# Patient Record
Sex: Male | Born: 1935 | Race: White | Hispanic: No | Marital: Married | Smoking: Never smoker
Health system: Southern US, Community
[De-identification: ages and names within clinical notes are randomized; demographics above are authoritative.]

## PROBLEM LIST (undated history)

## (undated) DIAGNOSIS — I639 Cerebral infarction, unspecified: Secondary | ICD-10-CM

## (undated) DIAGNOSIS — C61 Malignant neoplasm of prostate: Secondary | ICD-10-CM

## (undated) DIAGNOSIS — R930 Abnormal findings on diagnostic imaging of skull and head, not elsewhere classified: Secondary | ICD-10-CM

## (undated) DIAGNOSIS — I10 Essential (primary) hypertension: Secondary | ICD-10-CM

---

## 2014-07-15 ENCOUNTER — Inpatient Hospital Stay (HOSPITAL_COMMUNITY)
Admission: AD | Admit: 2014-07-15 | Discharge: 2014-07-18 | DRG: 684 | Disposition: A | Discharge status: Home / Self Care | Payer: Medicare Other | Source: Other Acute Inpatient Hospital | Attending: Internal Medicine | Admitting: Internal Medicine

## 2014-07-15 DIAGNOSIS — N400 Enlarged prostate without lower urinary tract symptoms: Secondary | ICD-10-CM | POA: Diagnosis present

## 2014-07-15 DIAGNOSIS — Z8673 Personal history of transient ischemic attack (TIA), and cerebral infarction without residual deficits: Secondary | ICD-10-CM | POA: Diagnosis not present

## 2014-07-15 DIAGNOSIS — N1339 Other hydronephrosis: Secondary | ICD-10-CM | POA: Diagnosis present

## 2014-07-15 DIAGNOSIS — Z8546 Personal history of malignant neoplasm of prostate: Secondary | ICD-10-CM | POA: Diagnosis not present

## 2014-07-15 DIAGNOSIS — I10 Essential (primary) hypertension: Secondary | ICD-10-CM | POA: Diagnosis present

## 2014-07-15 DIAGNOSIS — N139 Obstructive and reflux uropathy, unspecified: Secondary | ICD-10-CM | POA: Diagnosis present

## 2014-07-15 DIAGNOSIS — N320 Bladder-neck obstruction: Secondary | ICD-10-CM | POA: Diagnosis present

## 2014-07-15 DIAGNOSIS — E875 Hyperkalemia: Secondary | ICD-10-CM | POA: Diagnosis present

## 2014-07-15 DIAGNOSIS — N179 Acute kidney failure, unspecified: Secondary | ICD-10-CM | POA: Diagnosis present

## 2014-07-15 DIAGNOSIS — I639 Cerebral infarction, unspecified: Secondary | ICD-10-CM | POA: Diagnosis present

## 2014-07-15 HISTORY — DX: Cerebral infarction, unspecified: I63.9

## 2014-07-15 HISTORY — DX: Malignant neoplasm of prostate: C61

## 2014-07-15 HISTORY — DX: Essential (primary) hypertension: I10

## 2014-07-15 HISTORY — DX: Abnormal findings on diagnostic imaging of skull and head, not elsewhere classified: R93.0

## 2014-07-16 ENCOUNTER — Encounter (HOSPITAL_COMMUNITY): Payer: Self-pay | Admitting: Internal Medicine

## 2014-07-16 ENCOUNTER — Inpatient Hospital Stay (HOSPITAL_COMMUNITY): Payer: Medicare Other

## 2014-07-16 DIAGNOSIS — R930 Abnormal findings on diagnostic imaging of skull and head, not elsewhere classified: Secondary | ICD-10-CM | POA: Insufficient documentation

## 2014-07-16 DIAGNOSIS — I059 Rheumatic mitral valve disease, unspecified: Secondary | ICD-10-CM

## 2014-07-16 DIAGNOSIS — I639 Cerebral infarction, unspecified: Secondary | ICD-10-CM | POA: Diagnosis present

## 2014-07-16 DIAGNOSIS — N139 Obstructive and reflux uropathy, unspecified: Secondary | ICD-10-CM | POA: Diagnosis present

## 2014-07-16 DIAGNOSIS — N400 Enlarged prostate without lower urinary tract symptoms: Secondary | ICD-10-CM | POA: Diagnosis present

## 2014-07-16 DIAGNOSIS — N178 Other acute kidney failure: Secondary | ICD-10-CM

## 2014-07-16 DIAGNOSIS — E875 Hyperkalemia: Secondary | ICD-10-CM | POA: Diagnosis present

## 2014-07-16 DIAGNOSIS — I10 Essential (primary) hypertension: Secondary | ICD-10-CM | POA: Diagnosis present

## 2014-07-16 DIAGNOSIS — N179 Acute kidney failure, unspecified: Secondary | ICD-10-CM | POA: Insufficient documentation

## 2014-07-16 LAB — CBC WITH DIFFERENTIAL/PLATELET
BASOS ABS: 0 10*3/uL (ref 0.0–0.1)
Basophils Relative: 1 % (ref 0–1)
EOS PCT: 2 % (ref 0–5)
Eosinophils Absolute: 0.1 10*3/uL (ref 0.0–0.7)
HCT: 32.2 % — ABNORMAL LOW (ref 39.0–52.0)
Hemoglobin: 11 g/dL — ABNORMAL LOW (ref 13.0–17.0)
Lymphocytes Relative: 14 % (ref 12–46)
Lymphs Abs: 0.5 10*3/uL — ABNORMAL LOW (ref 0.7–4.0)
MCH: 28.7 pg (ref 26.0–34.0)
MCHC: 34.2 g/dL (ref 30.0–36.0)
MCV: 84.1 fL (ref 78.0–100.0)
Monocytes Absolute: 0.4 10*3/uL (ref 0.1–1.0)
Monocytes Relative: 11 % (ref 3–12)
NEUTROS PCT: 72 % (ref 43–77)
Neutro Abs: 2.9 10*3/uL (ref 1.7–7.7)
Platelets: 139 10*3/uL — ABNORMAL LOW (ref 150–400)
RBC: 3.83 MIL/uL — ABNORMAL LOW (ref 4.22–5.81)
RDW: 14 % (ref 11.5–15.5)
WBC: 3.9 10*3/uL — ABNORMAL LOW (ref 4.0–10.5)

## 2014-07-16 LAB — BASIC METABOLIC PANEL
ANION GAP: 20 — AB (ref 5–15)
BUN: 61 mg/dL — AB (ref 6–23)
CHLORIDE: 108 meq/L (ref 96–112)
CO2: 17 mEq/L — ABNORMAL LOW (ref 19–32)
Calcium: 9.8 mg/dL (ref 8.4–10.5)
Creatinine, Ser: 5.78 mg/dL — ABNORMAL HIGH (ref 0.50–1.35)
GFR calc Af Amer: 9 mL/min — ABNORMAL LOW (ref >90)
GFR calc non Af Amer: 8 mL/min — ABNORMAL LOW (ref >90)
Glucose, Bld: 80 mg/dL (ref 70–99)
POTASSIUM: 5 meq/L (ref 3.7–5.3)
Sodium: 145 mEq/L (ref 137–147)

## 2014-07-16 LAB — URINALYSIS, ROUTINE W REFLEX MICROSCOPIC
Bilirubin Urine: NEGATIVE
Glucose, UA: NEGATIVE mg/dL
Ketones, ur: 15 mg/dL — AB
Leukocytes, UA: NEGATIVE
NITRITE: NEGATIVE
PROTEIN: 30 mg/dL — AB
Specific Gravity, Urine: 1.01 (ref 1.005–1.030)
UROBILINOGEN UA: 0.2 mg/dL (ref 0.0–1.0)
pH: 7 (ref 5.0–8.0)

## 2014-07-16 LAB — PROTIME-INR
INR: 1.29 (ref 0.00–1.49)
PROTHROMBIN TIME: 16.3 s — AB (ref 11.6–15.2)

## 2014-07-16 LAB — COMPREHENSIVE METABOLIC PANEL
ALBUMIN: 3.9 g/dL (ref 3.5–5.2)
ALT: 20 U/L (ref 0–53)
AST: 18 U/L (ref 0–37)
Alkaline Phosphatase: 51 U/L (ref 39–117)
Anion gap: 19 — ABNORMAL HIGH (ref 5–15)
BUN: 75 mg/dL — AB (ref 6–23)
CALCIUM: 9.7 mg/dL (ref 8.4–10.5)
CO2: 19 mEq/L (ref 19–32)
Chloride: 106 mEq/L (ref 96–112)
Creatinine, Ser: 8.78 mg/dL — ABNORMAL HIGH (ref 0.50–1.35)
GFR calc non Af Amer: 5 mL/min — ABNORMAL LOW (ref >90)
GFR, EST AFRICAN AMERICAN: 6 mL/min — AB (ref >90)
GLUCOSE: 84 mg/dL (ref 70–99)
Potassium: 5.2 mEq/L (ref 3.7–5.3)
SODIUM: 144 meq/L (ref 137–147)
Total Bilirubin: 1 mg/dL (ref 0.3–1.2)
Total Protein: 7.2 g/dL (ref 6.0–8.3)

## 2014-07-16 LAB — URINE MICROSCOPIC-ADD ON

## 2014-07-16 LAB — MRSA PCR SCREENING: MRSA by PCR: NEGATIVE

## 2014-07-16 LAB — PHOSPHORUS: PHOSPHORUS: 5 mg/dL — AB (ref 2.3–4.6)

## 2014-07-16 LAB — MAGNESIUM: Magnesium: 2.3 mg/dL (ref 1.5–2.5)

## 2014-07-16 MED ORDER — SODIUM CHLORIDE 0.9 % IV SOLN
INTRAVENOUS | Status: DC
  Administered 2014-07-16: 50 mL/h via INTRAVENOUS
  Administered 2014-07-16: 02:00:00 via INTRAVENOUS

## 2014-07-16 MED ORDER — METOPROLOL TARTRATE 12.5 MG HALF TABLET
12.5000 mg | ORAL_TABLET | Freq: Two times a day (BID) | ORAL | Status: DC
Start: 2014-07-16 — End: 2014-07-18
  Administered 2014-07-16 – 2014-07-17 (×3): 12.5 mg via ORAL
  Filled 2014-07-16 (×6): qty 1

## 2014-07-16 MED ORDER — STERILE WATER FOR INJECTION IV SOLN
Freq: Once | INTRAVENOUS | Status: AC
  Administered 2014-07-16: 17:00:00 via INTRAVENOUS
  Filled 2014-07-16: qty 850

## 2014-07-16 MED ORDER — ACETAMINOPHEN 325 MG PO TABS
650.0000 mg | ORAL_TABLET | Freq: Four times a day (QID) | ORAL | Status: DC | PRN
  Administered 2014-07-16 (×2): 650 mg via ORAL
  Filled 2014-07-16 (×2): qty 2

## 2014-07-16 MED ORDER — PHENAZOPYRIDINE HCL 100 MG PO TABS: 100.0000 mg | ORAL_TABLET | Freq: Three times a day (TID) | ORAL | Status: DC

## 2014-07-16 MED ORDER — HYDRALAZINE HCL 25 MG PO TABS
25.0000 mg | ORAL_TABLET | Freq: Four times a day (QID) | ORAL | Status: DC
  Administered 2014-07-16 – 2014-07-18 (×11): 25 mg via ORAL
  Filled 2014-07-16 (×16): qty 1

## 2014-07-16 MED ORDER — SODIUM CHLORIDE 0.9 % IJ SOLN
3.0000 mL | Freq: Two times a day (BID) | INTRAMUSCULAR | Status: DC
  Administered 2014-07-16 – 2014-07-18 (×6): 3 mL via INTRAVENOUS

## 2014-07-16 MED ORDER — HEPARIN SODIUM (PORCINE) 5000 UNIT/ML IJ SOLN
5000.0000 [IU] | Freq: Three times a day (TID) | INTRAMUSCULAR | Status: DC
  Administered 2014-07-16 – 2014-07-17 (×5): 5000 [IU] via SUBCUTANEOUS
  Filled 2014-07-16 (×7): qty 1

## 2014-07-16 MED ORDER — ACETAMINOPHEN 650 MG RE SUPP: 650.0000 mg | Freq: Four times a day (QID) | RECTAL | Status: DC | PRN

## 2014-07-16 MED ORDER — ONDANSETRON HCL 4 MG PO TABS: 4.0000 mg | ORAL_TABLET | Freq: Four times a day (QID) | ORAL | Status: DC | PRN

## 2014-07-16 MED ORDER — ONDANSETRON HCL 4 MG/2ML IJ SOLN: 4.0000 mg | Freq: Four times a day (QID) | INTRAMUSCULAR | Status: DC | PRN

## 2014-07-16 MED ORDER — HYDRALAZINE HCL 20 MG/ML IJ SOLN
10.0000 mg | INTRAMUSCULAR | Status: DC | PRN
  Administered 2014-07-16: 10 mg via INTRAVENOUS
  Filled 2014-07-16: qty 1

## 2014-07-16 NOTE — H&P (Signed)
[facility] History and Physical  Patient: Chris Castillo  OTL:[id number]  DOB: 11-06-1928  DOS: the patient was seen and examined on 07/15/2014 PCP: [facility]  Chief Complaint: Diarrhea and leg swelling  HPI: Chris Castillo is a 80 y.o. male with Past medical history of hypertension, CVA, prostatic hypertrophy with possible history of cancer as well. The patient presents with complaints of diarrhea and leg swelling. He mentions that he had diarrhea for 2 weeks and after the third day of diarrhea he started noticing developing swelling in both his legs. During the episodes of diarrhea over 2 weeks he has gained 10 pounds. 3 days ago he stopped urinating and at which time he saw his PCP. His PCP sent him to hospital for an ultrasound and further lab work. Later on he was seen in the ER with above mentioned complaint and was found to have hyperkalemia and acute kidney injury and was treated for the same emergently and was sent here for further workup. At the time of my evaluation he complains of left leg cramps and burning at the insertion of the Foley catheter but does not have any complaint of any fever, chills, headache, cough, chest pain, palpitation, shortness of breath, orthopnea, PND, nausea, vomiting, abdominal pain, diarrhea, constipation, active bleeding, burning urination, dizziness, pedal edema,  focal neurological deficit.  He mentions he is compliant with all his medication and takes a medication for blood pressure although he does not remember the name. He mentions he has seen a urologist in the past but does not remember the name and has not seen him for a while. Patient mentions his diarrhea has improved and has not had any bowel movement since 10:00 in the morning. There was no blood in the bowels and patient denies any recent use of antibiotics or recent travel.  Patient received IV Lasix as well as a Foley catheter insertion after which she urinated 1500  mL of urine.  The patient is coming from home And at his baseline independent for most of his ADL.  Review of Systems: as mentioned in the history of present illness.  A Comprehensive review of the other systems is negative.  No past medical history on file. No past surgical history on file. Social History:  has no tobacco, alcohol, and drug history on file.  Not on File  No family history on file.  Prior to Admission medications   Medication Sig Start Date End Date Taking? Authorizing Provider  lisinopril (PRINIVIL,ZESTRIL) 10 MG tablet Take 10 mg by mouth daily.   Yes Historical Provider, MD  Multiple Vitamin (MULTIVITAMIN) capsule Take 1 capsule by mouth daily.   Yes Historical Provider, MD    Physical Exam: Filed Vitals:   07/15/14 2305  BP: 179/85  Pulse: 60  Temp: 97.9 F (36.6 C)  TempSrc: Oral  Resp: 11  Height: 5\' 6"  (1.676 m)  Weight: 76.8 kg (169 lb 5 oz)  SpO2: 99%    General: Alert, Awake and Oriented to Time, Place and Person. Appear in mild distress Eyes: PERRL ENT: Oral Mucosa clear moist. Neck: no JVD Cardiovascular: S1 and S2 Present, no Murmur, Peripheral Pulses Present Respiratory: Bilateral Air entry equal and Decreased, Clear to Auscultation, noCrackles, no wheezes Abdomen: Bowel Sound present, Soft and no tender Skin: no Rash Extremities: Bilateral Pedal edema, bilateral calf tenderness Neurologic: Grossly no focal neuro deficit.  Labs on Admission:  CBC:  Recent Labs Lab 07/16/14 0052  WBC 3.9*  NEUTROABS 2.9  HGB 11.0*  HCT 32.2*  MCV 84.1  PLT 139*    CMP     Component Value Date/Time   NA 144 07/16/2014 0052   K 5.2 07/16/2014 0052   CL 106 07/16/2014 0052   CO2 19 07/16/2014 0052   GLUCOSE 84 07/16/2014 0052   BUN 75* 07/16/2014 0052   CREATININE 8.78* 07/16/2014 0052   CALCIUM 9.7 07/16/2014 0052   PROT 7.2 07/16/2014 0052   ALBUMIN 3.9 07/16/2014 0052   AST 18 07/16/2014 0052   ALT 20 07/16/2014 0052   ALKPHOS 51  07/16/2014 0052   BILITOT 1.0 07/16/2014 0052   GFRNONAA 5* 07/16/2014 0052   GFRAA 6* 07/16/2014 0052   Radiological Exams on Admission: No results found.  CT scan of the abdomen in the other facility showed bilateral hydronephrosis with obstructive uropathy and enlarged prostate.  EKG: Independently reviewed. nonspecific ST and T waves changes, normal sinus rhythm   Assessment/Plan Principal Problem:   AKI (acute kidney injury) Active Problems:   Accelerated hypertension   Acute bilateral obstructive uropathy   Prostatic hypertrophy   Hyperkalemia   CVA (cerebral infarction)   1. AKI (acute kidney injury) The patient is presenting with complaints of diarrhea with decreased urination and bilateral leg swelling. On further workup he was found to have acute kidney injury with serum creatinine of 19 with other parameters being within normal range and CT of the abdomen showing significantly enlarged bladder along with enlarged prostate and bilateral hydronephrosis. He has urinated more than 1 L after insertion of the Foley catheter and he continues to do so right now. He has also received Lasix 40 mg IV in the other facility. Currently IV monitor his I's and O's closely and obtain further workup. Nephrology will be consulted. He may also require consultation with urology. For his hyperkalemia he has received bicarbonate dextrose and insulin and currently his potassium is improved significantly. We will continue to closely monitor.  2. Accelerated hypertension. Patient is on ACE inhibitor was at home at present I would hold them and put him on hydralazine IV as well as oral when necessary.  3. History of prostate cancer. Check PSA.  4. History of CVA. Close monitoring at present.  Advance goals of care discussion: Full code   Consults: Nephrology  DVT Prophylaxis: subcutaneous Heparin Nutrition: Renal diet  Family Communication: Attempted to reach the daughter were not  successful Disposition: Admitted to inpatient in step-down unit.  Author: Berle Mull, MD [facility] Pager: [phone] 07/16/2014, 12:38 AM    If 7PM-7AM, please contact night-coverage [email or web address] Password [email or web address]

## 2014-07-16 NOTE — Progress Notes (Signed)
Nephrogy:      Discussed with primary.  Cr decreased 19 to 8s.in < 24 h.  This is obstructive uropathy with AKI.  Amount of recovery to be determined but we will not do formal consult unless does not cont to improve, to simplify course.  Needs Urology eval

## 2014-07-16 NOTE — Progress Notes (Signed)
Utilization Review Completed.Donne Anon T12/15/2015

## 2014-07-16 NOTE — Progress Notes (Signed)
Pt is asleep. Pt has had no complaints of pain. Pt has had no requests. Pt speaks a minimal amount of english.

## 2014-07-16 NOTE — Progress Notes (Addendum)
[facility] TEAM 1 - Stepdown/ICU TEAM Progress Note  Chris Castillo HEN:[id number] DOB: May 04, 1929 DOA: 07/15/2014 PCP: [facility]  Admit HPI / Brief Narrative: Chris Castillo is a 80 y.o. HM PMHx hypertension, CVA, prostatic hypertrophy with Hx  Cancer. The patient presents with complaints of diarrhea and leg swelling. He mentions that he had diarrhea for 2 weeks and after the third day of diarrhea he started noticing developing swelling in both his legs. During the episodes of diarrhea over 2 weeks he has gained 10 pounds. 3 days ago he stopped urinating and at which time he saw his PCP. His PCP sent him to hospital for an ultrasound and further lab work. Later on he was seen in the ER with above mentioned complaint and was found to have hyperkalemia and acute kidney injury and was treated for the same emergently and was sent here for further workup. At the time of my evaluation he complains of left leg cramps and burning at the insertion of the Foley catheter but does not have any complaint of any fever, chills, headache, cough, chest pain, palpitation, shortness of breath, orthopnea, PND, nausea, vomiting, abdominal pain, diarrhea, constipation, active bleeding, burning urination, dizziness, pedal edema, focal neurological deficit.  He mentions he is compliant with all his medication and takes a medication for blood pressure although he does not remember the name. He mentions he has seen a urologist in the past but does not remember the name and has not seen him for a while. Patient mentions his diarrhea has improved and has not had any bowel movement since 10:00 in the morning. There was no blood in the bowels and patient denies any recent use of antibiotics or recent travel.  Patient received IV Lasix as well as a Foley catheter insertion after which he urinated 1500 mL of urine.   HPI/Subjective: 12/15 A/O 4, states feels improved since admission. NOTE patient's  primary language is Spanish however does understand simple [place]. Negative abdominal pain, negative diarrhea today. States last episode of diarrhea was yesterday at 1000, described as watery. States diarrhea started approximately 2 weeks ago. Negative fever/chills.  Assessment/Plan: AKI (acute kidney injury) -Improving with hydration  -Nephrology aware patient, but believes most likely secondary to obstruction. Spoke with Dr. Jeneen Rinks  Deterding (nephrology)  -In HPI abdominal CT mentioned however in Epic not available. Will obtain renal ultrasound. If obstruction apparent an ultrasound will consult urology. -Hold all nephrotoxic medication  -Bicarbonate 150 mEq in sterile water 1 -Continue gentle hydration normal saline 11ml/hr  Hyperkalemia -Resolved continue to monitor closely  Accelerated hypertension. -Patient is on ACE inhibitor at home will hold secondary to acute renal failure.  -Start metoprolol 12.5 mg BID -Continue hydralazine 25 mg QID -Obtain echocardiogram; suspect CHF.   Pedal  Edema -Most likely multifactorial acute renal failure, and CHF?  Hx Prostate cancer. -Hormone shots, Pt decided to stop ~ (1) yr ago per daughter  -PSA pending  -Renal ultrasound  Diarrhea -Obtain GI pathogen panel  History of CVA. -Control BP aggressively  -Obtain lipid panel   Code Status: FULL Family Communication: Spoke with daughter ice this manner over the phone, granddaughter, and wife at bedside  Disposition Plan: Resolution of renal failure    Consultants: Dr. Jeneen Rinks  Deterding (nephrology) phone consult    Procedure/Significant Events: Echocardiogram pending Renal ultrasound pending   Culture NA  Antibiotics: NA  DVT prophylaxis: SCD   Devices    LINES / TUBES:  Continuous Infusions: . sodium chloride 75 mL/hr at 07/16/14 0141    Objective: VITAL SIGNS: Temp: 98.5 F (36.9 C) (12/15 0733) Temp Source: Oral (12/15 0733) BP: 149/62 mmHg  (12/15 0733) Pulse Rate: 65 (12/15 1000) SPO2; FIO2:   Intake/Output Summary (Last 24 hours) at 07/16/14 1106 Last data filed at 07/16/14 0738  Gross per 24 hour  Intake    240 ml  Output   3750 ml  Net  -3510 ml     Exam: General: A/O 4, NAD, No acute respiratory distress Lungs: Clear to auscultation bilaterally without wheezes or crackles Cardiovascular: Regular rate and rhythm without murmur gallop or rub normal S1 and S2 Abdomen: Nontender, nondistended, soft, bowel sounds positive, no rebound, no ascites, no appreciable mass Extremities: No significant cyanosis, clubbing. 3+- 2+ bilateral pedal edema to thighs Lt > Rt,   Data Reviewed: Basic Metabolic Panel:  Recent Labs Lab 07/16/14 0052 07/16/14 0758  NA 144 145  K 5.2 5.0  CL 106 108  CO2 19 17*  GLUCOSE 84 80  BUN 75* 61*  CREATININE 8.78* 5.78*  CALCIUM 9.7 9.8  MG 2.3  --   PHOS 5.0*  --    Liver Function Tests:  Recent Labs Lab 07/16/14 0052  AST 18  ALT 20  ALKPHOS 51  BILITOT 1.0  PROT 7.2  ALBUMIN 3.9   No results for input(s): LIPASE, AMYLASE in the last 168 hours. No results for input(s): AMMONIA in the last 168 hours. CBC:  Recent Labs Lab 07/16/14 0052  WBC 3.9*  NEUTROABS 2.9  HGB 11.0*  HCT 32.2*  MCV 84.1  PLT 139*   Cardiac Enzymes: No results for input(s): CKTOTAL, CKMB, CKMBINDEX, TROPONINI in the last 168 hours. BNP (last 3 results) No results for input(s): PROBNP in the last 8760 hours. CBG: No results for input(s): GLUCAP in the last 168 hours.  Recent Results (from the past 240 hour(s))  MRSA PCR Screening     Status: None   Collection Time: 07/16/14 12:42 AM  Result Value Ref Range Status   MRSA by PCR NEGATIVE NEGATIVE Final    Comment:        The GeneXpert MRSA Assay (FDA approved for NASAL specimens only), is one component of a comprehensive MRSA colonization surveillance program. It is not intended to diagnose MRSA infection nor to guide  or monitor treatment for MRSA infections.      Studies:  Recent x-ray studies have been reviewed in detail by the Attending Physician  Scheduled Meds:  Scheduled Meds: . heparin  5,000 Units Subcutaneous 3 times per day  . hydrALAZINE  25 mg Oral 4 times per day  . sodium chloride  3 mL Intravenous Q12H    Time spent on care of this patient: 40 mins   Allie Bossier , MD   [facility] Office  [phone] Pager [phone]  On-Call/Text Page:      [email or web address]      password [email or web address]  If 7PM-7AM, please contact night-coverage [email or web address] Password [email or web address] 07/16/2014, 11:06 AM   LOS: 1 day

## 2014-07-16 NOTE — Progress Notes (Signed)
Nutrition Brief Note  Patient identified on the Malnutrition Screening Tool (MST) Report for recent weight lost without trying and eating poorly because of a decreased appetite.  Pt reports a 10 lb weight gain.  Wt Readings from Last 15 Encounters:  07/16/14 169 lb 5 oz (76.8 kg)    Body mass index is 27.34 kg/(m^2). Patient meets criteria for Overweight based on current BMI.   Current diet order is Renal w/1200 ml.  Reports a good appetite and is consuming approximately 75% of meals at this time. Labs and medications reviewed.   No nutrition interventions warranted at this time. If nutrition issues arise, please consult RD.   Arthur Holms, RD, LDN Pager #: [phone] After-Hours Pager #: [phone]

## 2014-07-16 NOTE — Progress Notes (Signed)
Echocardiogram 2D Echocardiogram has been performed.  Chris Castillo 07/16/2014, 3:22 PM

## 2014-07-17 LAB — COMPREHENSIVE METABOLIC PANEL
ALT: 15 U/L (ref 0–53)
ANION GAP: 14 (ref 5–15)
AST: 14 U/L (ref 0–37)
Albumin: 3.6 g/dL (ref 3.5–5.2)
Alkaline Phosphatase: 45 U/L (ref 39–117)
BUN: 29 mg/dL — AB (ref 6–23)
CO2: 20 meq/L (ref 19–32)
Calcium: 9.4 mg/dL (ref 8.4–10.5)
Chloride: 105 mEq/L (ref 96–112)
Creatinine, Ser: 1.87 mg/dL — ABNORMAL HIGH (ref 0.50–1.35)
GFR calc Af Amer: 37 mL/min — ABNORMAL LOW (ref >90)
GFR calc non Af Amer: 32 mL/min — ABNORMAL LOW (ref >90)
GLUCOSE: 101 mg/dL — AB (ref 70–99)
POTASSIUM: 4.2 meq/L (ref 3.7–5.3)
Sodium: 139 mEq/L (ref 137–147)
Total Bilirubin: 0.9 mg/dL (ref 0.3–1.2)
Total Protein: 6.7 g/dL (ref 6.0–8.3)

## 2014-07-17 LAB — CBC WITH DIFFERENTIAL/PLATELET
Basophils Absolute: 0 10*3/uL (ref 0.0–0.1)
Basophils Relative: 0 % (ref 0–1)
Eosinophils Absolute: 0.1 10*3/uL (ref 0.0–0.7)
Eosinophils Relative: 4 % (ref 0–5)
HCT: 32.1 % — ABNORMAL LOW (ref 39.0–52.0)
HEMOGLOBIN: 10.8 g/dL — AB (ref 13.0–17.0)
LYMPHS ABS: 0.8 10*3/uL (ref 0.7–4.0)
Lymphocytes Relative: 20 % (ref 12–46)
MCH: 28.3 pg (ref 26.0–34.0)
MCHC: 33.6 g/dL (ref 30.0–36.0)
MCV: 84 fL (ref 78.0–100.0)
MONOS PCT: 11 % (ref 3–12)
Monocytes Absolute: 0.5 10*3/uL (ref 0.1–1.0)
Neutro Abs: 2.6 10*3/uL (ref 1.7–7.7)
Neutrophils Relative %: 65 % (ref 43–77)
Platelets: 153 10*3/uL (ref 150–400)
RBC: 3.82 MIL/uL — AB (ref 4.22–5.81)
RDW: 13.9 % (ref 11.5–15.5)
WBC: 4 10*3/uL (ref 4.0–10.5)

## 2014-07-17 LAB — LIPID PANEL
Cholesterol: 190 mg/dL (ref 0–200)
HDL: 49 mg/dL (ref >39)
LDL Cholesterol: 126 mg/dL — ABNORMAL HIGH (ref 0–99)
Total CHOL/HDL Ratio: 3.9 RATIO
Triglycerides: 76 mg/dL (ref <150)
VLDL: 15 mg/dL (ref 0–40)

## 2014-07-17 LAB — MAGNESIUM: Magnesium: 1.8 mg/dL (ref 1.5–2.5)

## 2014-07-17 LAB — PSA: PSA: 54.8 ng/mL — AB (ref <=4.00)

## 2014-07-17 MED ORDER — TAMSULOSIN HCL 0.4 MG PO CAPS
0.4000 mg | ORAL_CAPSULE | Freq: Every day | ORAL | Status: DC
  Administered 2014-07-17 – 2014-07-18 (×2): 0.4 mg via ORAL
  Filled 2014-07-17 (×3): qty 1

## 2014-07-17 MED ORDER — ASPIRIN EC 81 MG PO TBEC: 81.0000 mg | DELAYED_RELEASE_TABLET | Freq: Every day | ORAL | Status: DC

## 2014-07-17 NOTE — Progress Notes (Addendum)
[facility] TEAM 1 - Stepdown/ICU TEAM Progress Note  Chris Castillo RXY:[id number] DOB: [birth year] DOA: 07/15/2014 PCP: [facility]  Brief Narrative: 80 year old male patient with past medical history of hypertension, reported old CVA, prosthetic hypertrophy with apparent history of cancer. Presented to the emergency department after experiencing 2 weeks of diarrhea subsequent development of edema in both legs. He reported a 10 pound gain in weight over the past 2 weeks. 3 days prior to presentation he stopped urinating and followed up with his primary care physician. He was sent to the hospital for an ultrasound and lab work. His lab work revealed acute renal failure. Baseline renal function was not reported.  He was subsequently sent to the ER and laboratory data here revealed BUN 75 creatinine 8.78 with a potassium of 5.2. A Foley catheter was placed. Review of medication revealed patient was on ACE inhibitor for hypertension prior to admission. When questioned he did report seeing a urologist previously but does not remember the name of the urologist. He was also given a dose of IV Lasix in the ER and after placement of the catheter 1500 mL of urine was obtained.  A renal ultrasound completed after arrival to this facility revealed mild-to-moderate symmetrical bilateral hydronephrosis without obstructing stone or mass lesion. He was also noted with urinary bladder wall thickening likely due to chronic bladder outflow obstruction. Since admission he has been hydrated. He was evaluated by nephrology who felt this was an obstructive uropathy based on ultrasound findings and rapid improvement in renal function after Foley catheter placed. By 12/16 his creatinine had decreased to 1.87 with a BUN of 29.  On 12/16 Dr. Eulogio Ditch with urology was contacted. He agreed with our starting Flomax and recommended leaving the Foley catheter in if the patient was to be discharged within the next 48  hours. He recommended the patient follow-up at his office after discharge. The office was contacted on 12/16 and they are in the process of arranging an outpatient appointment.  Subjective: Alert, notes having increased urinary output and attributes this to Foley. Patient speaks limited English but by talking with him and utilizing drawings of urinary anatomy patient was able to somewhat understand current clinical status. No complaints verbalized.  Assessment/Plan:  AKI secondary to obstructive uropathy -Improving after placement of Foley catheter and therefore relieving obstructive process as well as with hydration -Decrease IV fluids to keep open since hypertensive -Nephrology endorses etiology most likely secondary to obstruction. -Renal ultrasound demonstrates mild residual bilateral hydronephrosis without any stone or obstructing mass lesion. With history of BPH suspect this is etiology to patient's uropathy; also has bladder wall thickening which suggests a chronic process -Continue to hold ACE inhibitor -Begin Flomax  Hyperkalemia -Resolved with improvement in renal function  Accelerated hypertension. -Home ACE inhibitor on hold -Was started on metoprolol and hydralazine this admission -Have also started Flomax for suspected BPH mediated obstructive uropathy so monitor blood pressure closely -Echocardiogram completed this admission with preserved LV function and grade 1 diastolic dysfunction with moderate AR  Pedal  Edema -With preserved LV function despite moderate aortic regurgitation suspect edema more reflective of acute nephropathy related to acute renal failure (see above) -Edema resolving  Hx Prostate cancer -Apparently was on hormone injections previously but decided to stop ~ (1) yr ago per daughter  -PSA elevated at 54.8 but baseline unknown; patient to follow up with Urologist after discharge  Diarrhea -Resolved and likely related to acute renal failure -GI  pathogen panel pending  History of HEMORRHAGIC CVA (per daughter's hx) -Living at home prior to admission - family requests NO ASA or heparin products  Code Status: FULL Family Communication: No family at bedside; have attempted 1 to call all 3 numbers in chart to discuss current discharge plan with daughter Lindajo Royal but was unable to reach her DVT prophylaxis: Continue SCDs alone, still with some mild hematuria likely from Foley trauma Disposition Plan: Transfer to floor  Consultants: Dr. Jeneen Rinks  Deterding (nephrology) phone consult  Procedure/Significant Events: Echocardiogram: Left ventricle: The cavity size was normal. Wall thickness was normal. Systolic function was normal. The estimated ejection fraction was in the range of 55% to 60%. Wall motion was normal; there were no regional wall motion abnormalities. Doppler parameters are consistent with abnormal left ventricular relaxation (grade 1 diastolic dysfunction). - Aortic valve: There was moderate regurgitation. - Aortic root: The aortic root was mildly dilated. - Pulmonary arteries: Systolic pressure was mildly increased.  Objective: Blood pressure 157/84, pulse 77, temperature 98.3 F (36.8 C), temperature source Oral, resp. rate 16, height 5\' 6"  (1.676 m), weight 75.1 kg (165 lb 9.1 oz), SpO2 95 %.  Intake/Output Summary (Last 24 hours) at 07/17/14 1258 Last data filed at 07/17/14 1122  Gross per 24 hour  Intake 2245.17 ml  Output   3900 ml  Net -1654.83 ml   Exam: General: No acute respiratory distress Lungs: Clear to auscultation bilaterally without wheezes or crackles, room air Cardiovascular: Regular rate and rhythm without murmur gallop or rub normal S1 and S2 Abdomen: Nontender, nondistended, soft, bowel sounds positive, no rebound, no ascites, no appreciable mass Extremities: No significant cyanosis, clubbing. 1-2+ bilateral lower extremity edema left greater than right  Data Reviewed: Basic  Metabolic Panel:  Recent Labs Lab 07/16/14 0052 07/16/14 0758 07/17/14 0348  NA 144 145 139  K 5.2 5.0 4.2  CL 106 108 105  CO2 19 17* 20  GLUCOSE 84 80 101*  BUN 75* 61* 29*  CREATININE 8.78* 5.78* 1.87*  CALCIUM 9.7 9.8 9.4  MG 2.3  --  1.8  PHOS 5.0*  --   --    Liver Function Tests:  Recent Labs Lab 07/16/14 0052 07/17/14 0348  AST 18 14  ALT 20 15  ALKPHOS 51 45  BILITOT 1.0 0.9  PROT 7.2 6.7  ALBUMIN 3.9 3.6   CBC:  Recent Labs Lab 07/16/14 0052 07/17/14 0348  WBC 3.9* 4.0  NEUTROABS 2.9 2.6  HGB 11.0* 10.8*  HCT 32.2* 32.1*  MCV 84.1 84.0  PLT 139* 153    Recent Results (from the past 240 hour(s))  MRSA PCR Screening     Status: None   Collection Time: 07/16/14 12:42 AM  Result Value Ref Range Status   MRSA by PCR NEGATIVE NEGATIVE Final    Comment:        The GeneXpert MRSA Assay (FDA approved for NASAL specimens only), is one component of a comprehensive MRSA colonization surveillance program. It is not intended to diagnose MRSA infection nor to guide or monitor treatment for MRSA infections.      Studies:  Recent x-ray studies have been reviewed in detail by the Attending Physician  Scheduled Meds:  Scheduled Meds: . heparin  5,000 Units Subcutaneous 3 times per day  . hydrALAZINE  25 mg Oral 4 times per day  . metoprolol tartrate  12.5 mg Oral BID  . sodium chloride  3 mL Intravenous Q12H  . tamsulosin  0.4 mg Oral Daily    Time  spent on care of this patient: 35 mins   ELLIS,ALLISON L. , ANP  [facility] Office  [phone] Pager - [phone]  On-Call/Text Page:      [email or web address]      password [email or web address]  If 7PM-7AM, please contact night-coverage [email or web address] Password [email or web address] 07/17/2014, 12:58 PM   LOS: 2 days   I have personally examined this patient and reviewed the entire database. I have reviewed the above note, made any necessary editorial changes, and agree with its content.  Cherene Altes,  MD [facility]

## 2014-07-17 NOTE — Progress Notes (Signed)
Patient ID: Chris Castillo, male   DOB: 1928/08/26, 80 y.o.   MRN: [id number] Nephrology:        Renal function improved dramatically as anticipated with obstruction.  Please get Urology involved.

## 2014-07-18 ENCOUNTER — Encounter (HOSPITAL_COMMUNITY): Payer: Self-pay | Admitting: *Deleted

## 2014-07-18 LAB — CBC
HCT: 32.7 % — ABNORMAL LOW (ref 39.0–52.0)
Hemoglobin: 11.2 g/dL — ABNORMAL LOW (ref 13.0–17.0)
MCH: 28.6 pg (ref 26.0–34.0)
MCHC: 34.3 g/dL (ref 30.0–36.0)
MCV: 83.4 fL (ref 78.0–100.0)
PLATELETS: 152 10*3/uL (ref 150–400)
RBC: 3.92 MIL/uL — AB (ref 4.22–5.81)
RDW: 13.8 % (ref 11.5–15.5)
WBC: 3.8 10*3/uL — ABNORMAL LOW (ref 4.0–10.5)

## 2014-07-18 LAB — RENAL FUNCTION PANEL
ALBUMIN: 3.5 g/dL (ref 3.5–5.2)
Anion gap: 13 (ref 5–15)
BUN: 18 mg/dL (ref 6–23)
CALCIUM: 9.6 mg/dL (ref 8.4–10.5)
CO2: 21 meq/L (ref 19–32)
Chloride: 105 mEq/L (ref 96–112)
Creatinine, Ser: 1.17 mg/dL (ref 0.50–1.35)
GFR calc Af Amer: 65 mL/min — ABNORMAL LOW (ref >90)
GFR, EST NON AFRICAN AMERICAN: 56 mL/min — AB (ref >90)
Glucose, Bld: 102 mg/dL — ABNORMAL HIGH (ref 70–99)
Phosphorus: 2.7 mg/dL (ref 2.3–4.6)
Potassium: 3.9 mEq/L (ref 3.7–5.3)
SODIUM: 139 meq/L (ref 137–147)

## 2014-07-18 MED ORDER — TAMSULOSIN HCL 0.4 MG PO CAPS: 0.4000 mg | ORAL_CAPSULE | Freq: Every day | ORAL | Status: AC

## 2014-07-18 MED ORDER — HYDRALAZINE HCL 25 MG PO TABS: 25.0000 mg | ORAL_TABLET | Freq: Four times a day (QID) | ORAL | Status: AC

## 2014-07-18 MED ORDER — METOPROLOL TARTRATE 25 MG PO TABS
25.0000 mg | ORAL_TABLET | Freq: Two times a day (BID) | ORAL | Status: DC
  Administered 2014-07-18: 25 mg via ORAL
  Filled 2014-07-18 (×2): qty 1

## 2014-07-18 MED ORDER — METOPROLOL TARTRATE 25 MG PO TABS: 25.0000 mg | ORAL_TABLET | Freq: Two times a day (BID) | ORAL | Status: AC

## 2014-07-18 NOTE — Discharge Summary (Signed)
Physician Discharge Summary  Dallin Mccorkel WNI:[id number] DOB: 1928/11/02 DOA: 07/15/2014  PCP: [place] date: 07/15/2014 Discharge date: 07/18/2014  Time spent: 40 minutes  Recommendations for Outpatient Follow-up:  AKI secondary to obstructive uropathy -AKA resolved with hydration and Foley placement for relieving obstruction  -Nephrology endorses etiology most likely secondary to obstruction. -Renal ultrasound demonstrates mild residual bilateral hydronephrosis without any stone or obstructing mass lesion. With history of BPH suspect this is etiology to patient's uropathy; also has bladder wall thickening which suggests a chronic process -Continue to hold ACE inhibitor -Begin Flomax -Discharge with Foley, has spoken with RN Roselyn Reef (triage) at [facility], who will contact patient with first available date   Hyperkalemia -Resolved with improvement in renal function  Accelerated hypertension. -Home ACE inhibitor on hold -Discharge on metoprolol 25 mg  BID and Hydralazine 25 mg QID -Continue Flomax 0.4 mg daily   Diastolic dysfunction with moderate atrial wall regurgitation -See accelerated hypertension  Pedal Edema -With preserved LV function despite moderate aortic regurgitation suspect edema more reflective of acute nephropathy related to acute renal failure (see above) -Edema resolving  Hx Prostate cancer -Apparently was on hormone injections previously but decided to stop ~ (1) yr ago per daughter  -PSA elevated at 54.8 but baseline unknown; patient to follow up with Urologist after discharge  Diarrhea -Resolved and likely related to acute renal failure -GI pathogen panel pending  History of HEMORRHAGIC CVA (per daughter's hx) -Living at home prior to admission - family requests NO ASA or heparin products     Discharge Diagnoses:  Principal Problem:   AKI (acute kidney injury) Active Problems:   Accelerated hypertension  Acute bilateral obstructive uropathy   Prostatic hypertrophy   Hyperkalemia   CVA (cerebral infarction)   Acute kidney failure   Discharge Condition: Stable  Diet recommendation: Heart Healthy  Filed Weights   07/15/14 2305 07/16/14 0500 07/17/14 0351  Weight: 76.8 kg (169 lb 5 oz) 76.8 kg (169 lb 5 oz) 75.1 kg (165 lb 9.1 oz)    History of present illness:  Chris Castillo is a 80 y.o. HM PMHx hypertension, CVA, prostatic hypertrophy with Hx Cancer. The patient presents with complaints of diarrhea and leg swelling. He mentions that he had diarrhea for 2 weeks and after the third day of diarrhea he started noticing developing swelling in both his legs. During the episodes of diarrhea over 2 weeks he has gained 10 pounds. 3 days ago he stopped urinating and at which time he saw his PCP. His PCP sent him to hospital for an ultrasound and further lab work. Later on he was seen in the ER with above mentioned complaint and was found to have hyperkalemia and acute kidney injury and was treated for the same emergently and was sent here for further workup. At the time of my evaluation he complains of left leg cramps and burning at the insertion of the Foley catheter but does not have any complaint of any fever, chills, headache, cough, chest pain, palpitation, shortness of breath, orthopnea, PND, nausea, vomiting, abdominal pain, diarrhea, constipation, active bleeding, burning urination, dizziness, pedal edema, focal neurological deficit.  He mentions he is compliant with all his medication and takes a medication for blood pressure although he does not remember the name. He mentions he has seen a urologist in the past but does not remember the name and has not seen him for a while. Patient mentions his diarrhea has improved and has not had  any bowel movement since 10:00 in the morning. There was no blood in the bowels and patient denies any recent use of antibiotics or recent travel.  Patient  received IV Lasix as well as a Foley catheter insertion after which he urinated 1500 mL of urine. During this admission patient was diagnosed with urinary tract obstruction, significantly elevated PSA (no previous PSA for comparison), and acute renal failure. Patient was hydrated aggressively and had a difficult but successful Foley placement, which has alleviated patient's signs of acute renal failure (creatinine normalized). Patient will be discharged with Foley in place and will follow-up with urology. Patient was also found to have diastolic dysfunction with moderate aortic valve regurgitation.   Consultants: Dr. Mauricia Area (nephrology) phone consult    Procedure/Significant Events: 12/15 echocardiogram; -LVEF= 55% - 60%. -(grade 1 diastolic dysfunction). - Aortic valve:  moderate regurgitation. - Aortic root: mildly dilated. - Pulmonary arteries: Systolic pressure was mildly increased. 12/15 renal ultrasound;- Mild to moderate symmetric bilateral hydronephrosis without an obstructing stone or mass lesion visible. Probably represents residual hydronephrosis associated with bladder outflow obstruction. - urinary bladder wall thickening,probably due to chronic bladder outflow obstruction.    Culture NA   Discharge Exam: Filed Vitals:   07/17/14 1840 07/17/14 1936 07/17/14 2217 07/18/14 0608  BP: 157/84 152/76 170/82 142/74  Pulse:  87 73 75  Temp:  97.9 F (36.6 C) 98.3 F (36.8 C) 98 F (36.7 C)  TempSrc:  Oral Oral   Resp:  18 18 18   Height:      Weight:      SpO2:  97% 95% 97%    General: A/O 4, NAD, No acute respiratory distress Lungs: Clear to auscultation bilaterally without wheezes or crackles Cardiovascular: Regular rate and rhythm without murmur gallop or rub normal S1 and S2 Abdomen: Nontender, nondistended, soft, bowel sounds positive, no rebound, no ascites, no appreciable mass Extremities: No significant cyanosis, clubbing. 2+- 1+ bilateral pedal  edema Lt > Rt,  Discharge Instructions     Medication List    STOP taking these medications        lisinopril 10 MG tablet  Commonly known as:  PRINIVIL,ZESTRIL      TAKE these medications        hydrALAZINE 25 MG tablet  Commonly known as:  APRESOLINE  Take 1 tablet (25 mg total) by mouth every 6 (six) hours.     latanoprost 0.005 % ophthalmic solution  Commonly known as:  XALATAN  Place 1 drop into both eyes at bedtime.     metoprolol tartrate 25 MG tablet  Commonly known as:  LOPRESSOR  Take 1 tablet (25 mg total) by mouth 2 (two) times daily.     tamsulosin 0.4 MG Caps capsule  Commonly known as:  FLOMAX  Take 1 capsule (0.4 mg total) by mouth daily.       No Known Allergies Follow-up Information    Follow up with [facility]. Schedule an appointment as soon as possible for a visit in 1 week.   Why:  Follow-up acute renal failure secondary to obstructive uropathy, diastolic dysfunction, history prostate cancer with significantly elevated PSA   Contact information:   [place] [address] [phone]        The results of significant diagnostics from this hospitalization (including imaging, microbiology, ancillary and laboratory) are listed below for reference.    Significant Diagnostic Studies: US Renal  07/16/2014   CLINICAL DATA:  Acute kidney failure.  Evaluate for obstruction.  EXAM: RENAL/URINARY TRACT ULTRASOUND COMPLETE  COMPARISON:  None.  FINDINGS: Right Kidney:  Length: 10.7 cm. Mild to moderate RIGHT hydronephrosis is present. Calyceal dilation is present along with pelvic dilation. No obstructing lesion identified. No mass.  Left Kidney:  Length: 11.7 cm. Mild to moderate hydronephrosis is present, similar to the contralateral side. No obstructing lesion. No mass.  Bladder:  Foley catheter. 2 cm wall thickness, compatible with chronic bladder outflow obstruction or cystitis.  IMPRESSION: 1. Mild to moderate symmetric  bilateral hydronephrosis without an obstructing stone or mass lesion visible. This probably represents residual hydronephrosis associated with bladder outflow obstruction. 2. Urinary bladder collapsed with urinary bladder wall thickening, probably due to chronic bladder outflow obstruction.   Electronically Signed   By: Dereck Ligas M.D.   On: 07/16/2014 21:24    Microbiology: Recent Results (from the past 240 hour(s))  MRSA PCR Screening     Status: None   Collection Time: 07/16/14 12:42 AM  Result Value Ref Range Status   MRSA by PCR NEGATIVE NEGATIVE Final    Comment:        The GeneXpert MRSA Assay (FDA approved for NASAL specimens only), is one component of a comprehensive MRSA colonization surveillance program. It is not intended to diagnose MRSA infection nor to guide or monitor treatment for MRSA infections.      Labs: Basic Metabolic Panel:  Recent Labs Lab 07/16/14 0052 07/16/14 0758 07/17/14 0348 07/18/14 0425  NA 144 145 139 139  K 5.2 5.0 4.2 3.9  CL 106 108 105 105  CO2 19 17* 20 21  GLUCOSE 84 80 101* 102*  BUN 75* 61* 29* 18  CREATININE 8.78* 5.78* 1.87* 1.17  CALCIUM 9.7 9.8 9.4 9.6  MG 2.3  --  1.8  --   PHOS 5.0*  --   --  2.7   Liver Function Tests:  Recent Labs Lab 07/16/14 0052 07/17/14 0348 07/18/14 0425  AST 18 14  --   ALT 20 15  --   ALKPHOS 51 45  --   BILITOT 1.0 0.9  --   PROT 7.2 6.7  --   ALBUMIN 3.9 3.6 3.5   No results for input(s): LIPASE, AMYLASE in the last 168 hours. No results for input(s): AMMONIA in the last 168 hours. CBC:  Recent Labs Lab 07/16/14 0052 07/17/14 0348 07/18/14 0425  WBC 3.9* 4.0 3.8*  NEUTROABS 2.9 2.6  --   HGB 11.0* 10.8* 11.2*  HCT 32.2* 32.1* 32.7*  MCV 84.1 84.0 83.4  PLT 139* 153 152   Cardiac Enzymes: No results for input(s): CKTOTAL, CKMB, CKMBINDEX, TROPONINI in the last 168 hours. BNP: BNP (last 3 results) No results for input(s): PROBNP in the last 8760 hours. CBG: No  results for input(s): GLUCAP in the last 168 hours.     Signed:  Dia Crawford, MD [facility] [phone] pager

## 2014-07-18 NOTE — Progress Notes (Signed)
Patient Discharge:  Disposition:  Pt discharged home with family  Education: Pt and family educated on medications, follow up appointments, and all discharge instructions.  Pt and family verbalized understanding.  IV: Removed  Telemetry: N/A  Follow-up appointments:Reviewed with pt and family  Prescriptions: Scripts given to pt  Transportation: Transported home by family  Belongings:All belongings taken with pt

## 2014-07-18 NOTE — Plan of Care (Signed)
Problem: Phase I Progression Outcomes Goal: Voiding-avoid urinary catheter unless indicated Outcome: Not Applicable Date Met:  28/83/37 Patient is requiring a Foley and will go home with the Foley.  He will follow up with an urologist outpatient.

## 2015-11-01 DEATH — deceased

## 2016-02-07 IMAGING — US US RENAL
1 series · 14 of 25 positions shown · non-contrast
Comparison: None.

CLINICAL DATA: Acute kidney failure.  Evaluate for obstruction.

EXAM:
RENAL/URINARY TRACT ULTRASOUND COMPLETE

[Series 1: us renal · 0.21mm/px · 14 of 35 slices shown]
[im 1/35]
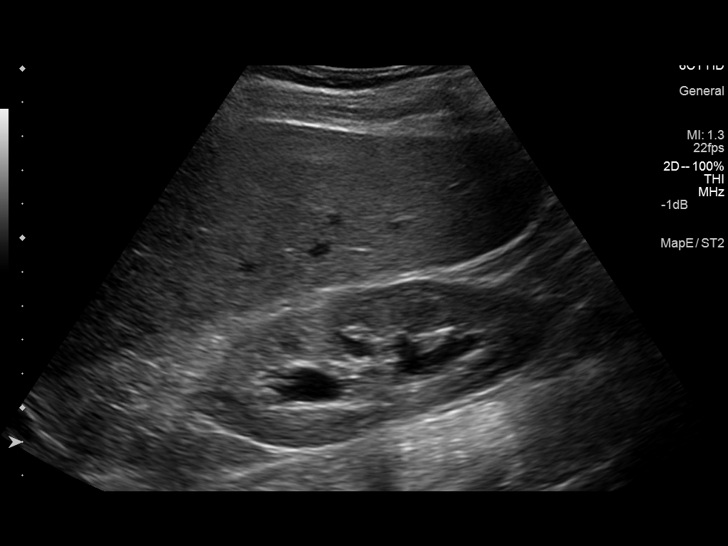
[im 3/35]
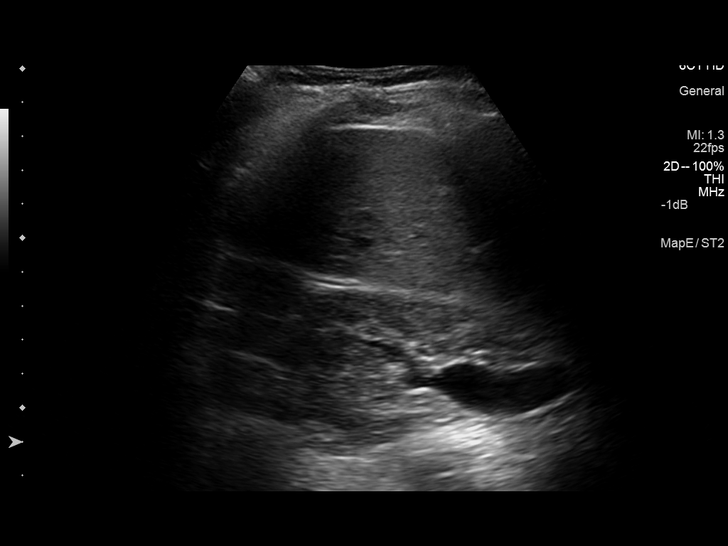
[im 6/35]
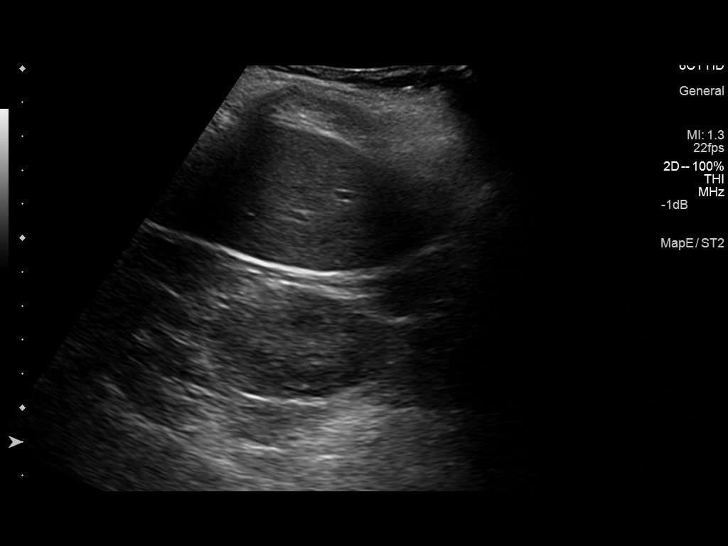
[im 9/35]
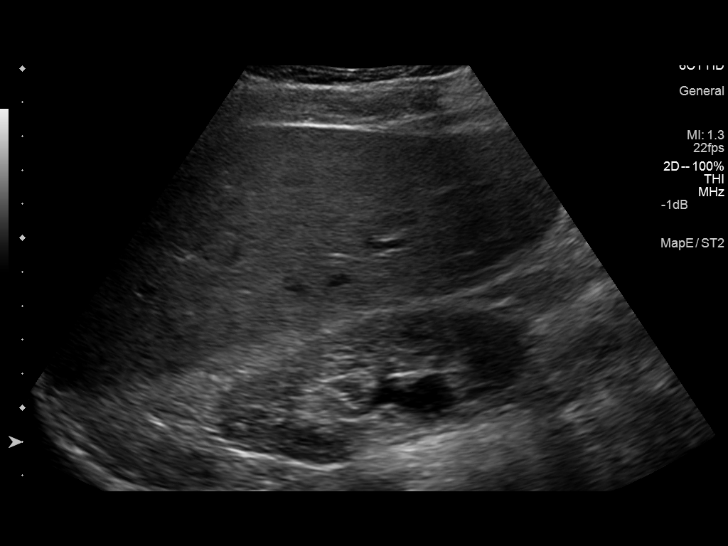
[im 12/35]
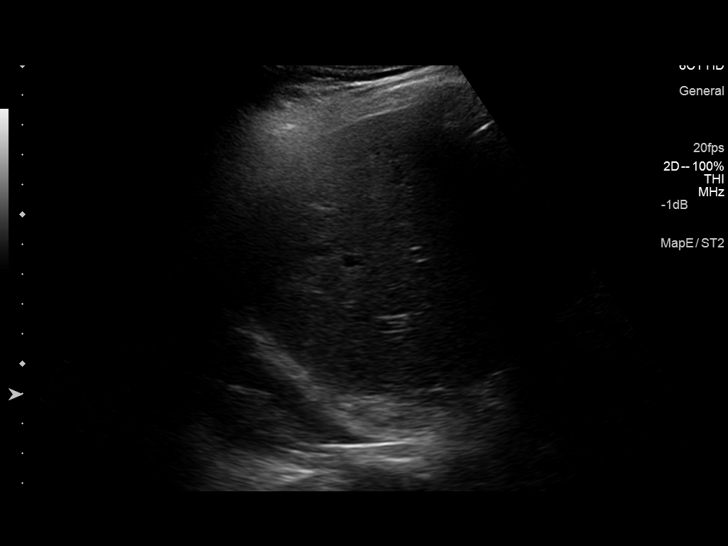
[im 13/35]
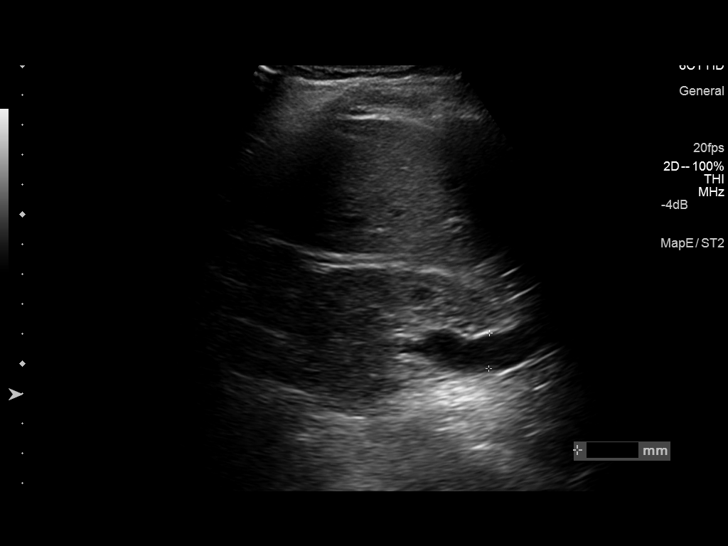
[im 16/35]
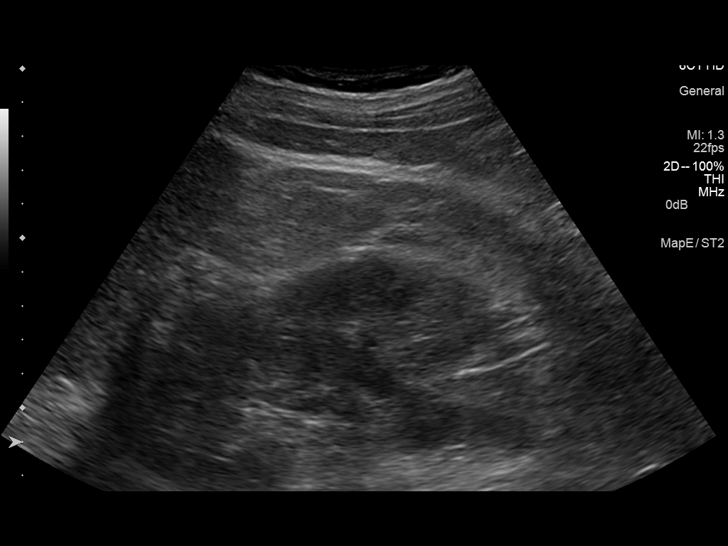
[im 19/35]
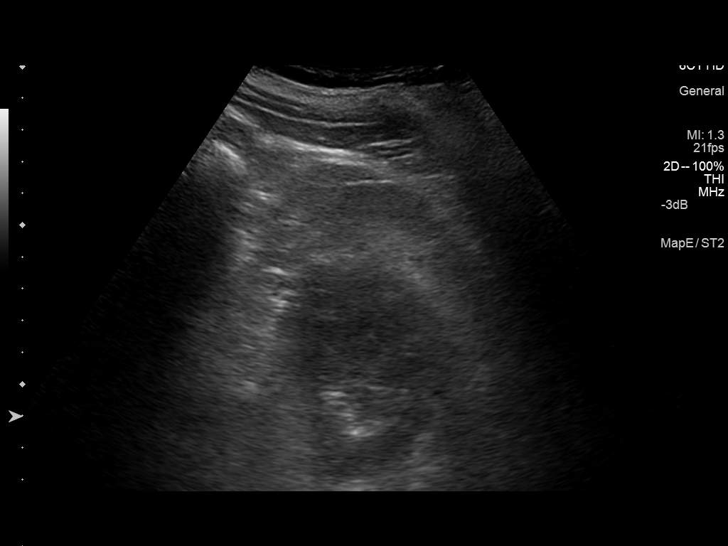
[im 22/35]
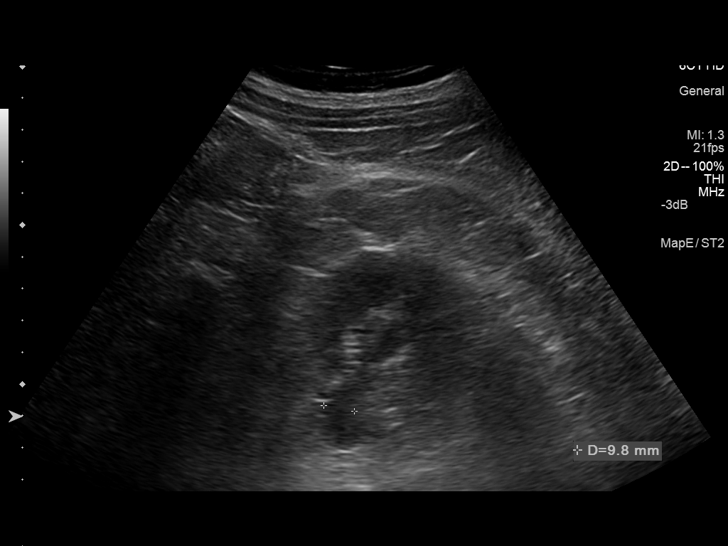
[im 23/35]
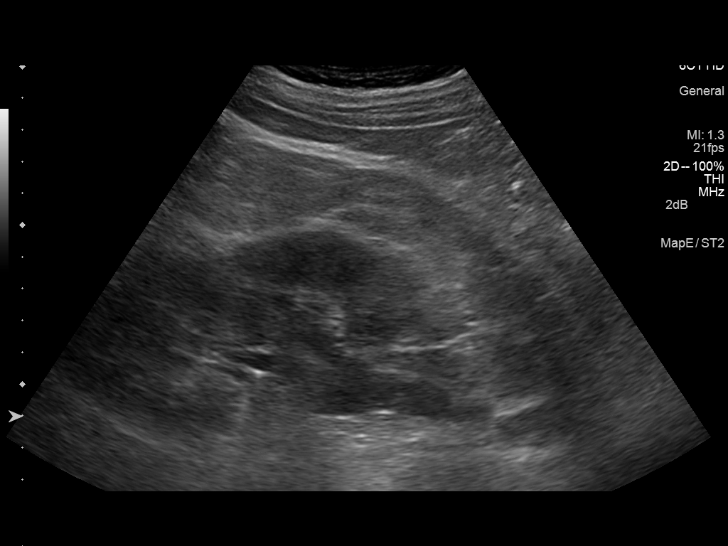
[im 26/35]
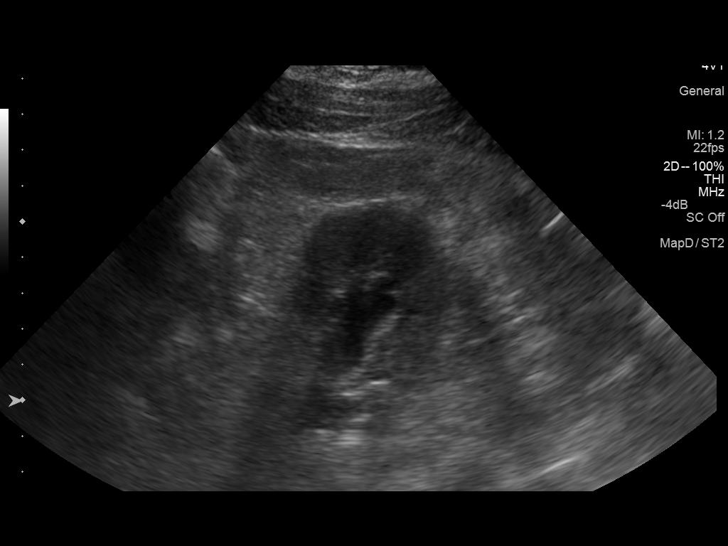
[im 29/35]
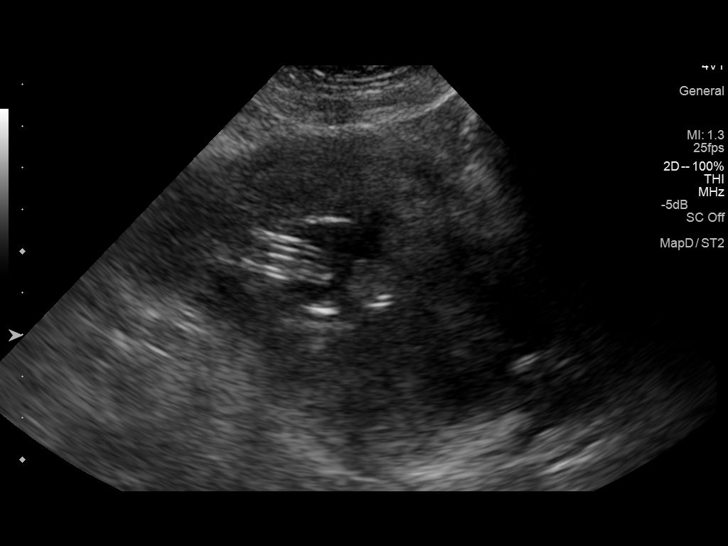
[im 32/35]
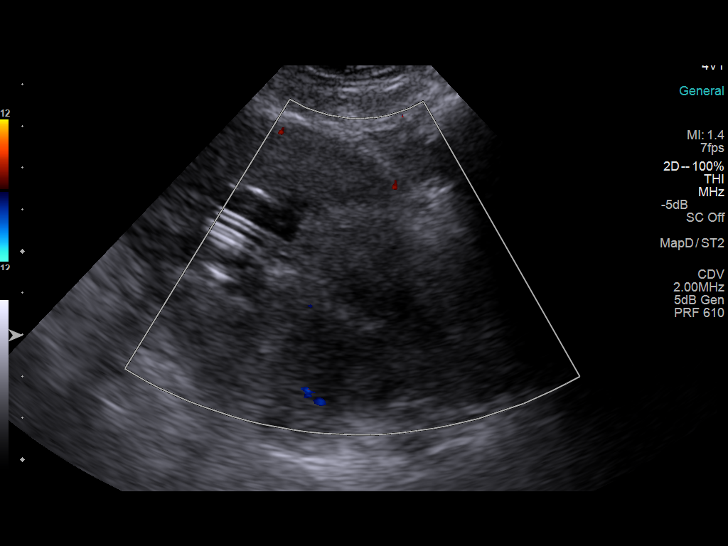
[im 35/35]
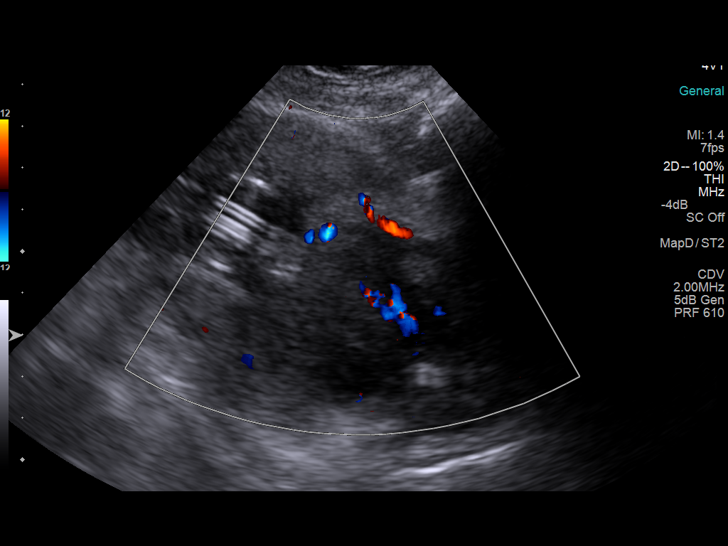

[14 of 25 positions shown; findings below may reference images not displayed]

FINDINGS: Right Kidney:

Length: 10.7 cm. Mild to moderate RIGHT hydronephrosis is present.
Calyceal dilation is present along with pelvic dilation. No
obstructing lesion identified. No mass.

Left Kidney:

Length: 11.7 cm. Mild to moderate hydronephrosis is present, similar
to the contralateral side. No obstructing lesion. No mass.

Bladder:

Foley catheter. 2 cm wall thickness, compatible with chronic bladder
outflow obstruction or cystitis.
IMPRESSION: 1. Mild to moderate symmetric bilateral hydronephrosis without an
obstructing stone or mass lesion visible. This probably represents
residual hydronephrosis associated with bladder outflow obstruction.
2. Urinary bladder collapsed with urinary bladder wall thickening,
probably due to chronic bladder outflow obstruction.
